# Patient Record
Sex: Male | Born: 1970 | Race: Black or African American | Hispanic: No | Marital: Single
Health system: Southern US, Community
[De-identification: ages and names within clinical notes are randomized; demographics above are authoritative.]

---

## 2020-03-08 ENCOUNTER — Other Ambulatory Visit: Payer: Self-pay

## 2020-03-08 ENCOUNTER — Emergency Department (HOSPITAL_COMMUNITY)
Admission: EM | Admit: 2020-03-08 | Discharge: 2020-03-08 | Disposition: A | Discharge status: Home / Self Care | Payer: Self-pay | Attending: Emergency Medicine | Admitting: Emergency Medicine

## 2020-03-08 ENCOUNTER — Encounter (HOSPITAL_COMMUNITY): Payer: Self-pay | Admitting: Pediatrics

## 2020-03-08 ENCOUNTER — Emergency Department (HOSPITAL_COMMUNITY): Payer: Self-pay

## 2020-03-08 DIAGNOSIS — R0602 Shortness of breath: Secondary | ICD-10-CM

## 2020-03-08 DIAGNOSIS — R059 Cough, unspecified: Secondary | ICD-10-CM

## 2020-03-08 DIAGNOSIS — E876 Hypokalemia: Secondary | ICD-10-CM

## 2020-03-08 DIAGNOSIS — Z20822 Contact with and (suspected) exposure to covid-19: Secondary | ICD-10-CM

## 2020-03-08 DIAGNOSIS — R05 Cough: Secondary | ICD-10-CM | POA: Insufficient documentation

## 2020-03-08 LAB — CBC WITH DIFFERENTIAL/PLATELET
Abs Immature Granulocytes: 0.02 10*3/uL (ref 0.00–0.07)
Basophils Absolute: 0.1 10*3/uL (ref 0.0–0.1)
Basophils Relative: 1 %
Eosinophils Absolute: 0.2 10*3/uL (ref 0.0–0.5)
Eosinophils Relative: 3 %
HCT: 48.3 % (ref 39.0–52.0)
Hemoglobin: 15.8 g/dL (ref 13.0–17.0)
Immature Granulocytes: 0 %
Lymphocytes Relative: 27 %
Lymphs Abs: 1.5 10*3/uL (ref 0.7–4.0)
MCH: 27.8 pg (ref 26.0–34.0)
MCHC: 32.7 g/dL (ref 30.0–36.0)
MCV: 85 fL (ref 80.0–100.0)
Monocytes Absolute: 0.5 10*3/uL (ref 0.1–1.0)
Monocytes Relative: 10 %
Neutro Abs: 3.2 10*3/uL (ref 1.7–7.7)
Neutrophils Relative %: 59 %
Platelets: 259 10*3/uL (ref 150–400)
RBC: 5.68 MIL/uL (ref 4.22–5.81)
RDW: 12 % (ref 11.5–15.5)
WBC: 5.4 10*3/uL (ref 4.0–10.5)
nRBC: 0 % (ref 0.0–0.2)

## 2020-03-08 LAB — BASIC METABOLIC PANEL
Anion gap: 12 (ref 5–15)
BUN: 19 mg/dL (ref 6–20)
CO2: 26 mmol/L (ref 22–32)
Calcium: 9.1 mg/dL (ref 8.9–10.3)
Chloride: 98 mmol/L (ref 98–111)
Creatinine, Ser: 1.28 mg/dL — ABNORMAL HIGH (ref 0.61–1.24)
GFR calc Af Amer: >60 mL/min (ref >60)
GFR calc non Af Amer: >60 mL/min (ref >60)
Glucose, Bld: 100 mg/dL — ABNORMAL HIGH (ref 70–99)
Potassium: 2.9 mmol/L — ABNORMAL LOW (ref 3.5–5.1)
Sodium: 136 mmol/L (ref 135–145)

## 2020-03-08 LAB — POC SARS CORONAVIRUS 2 AG -  ED: SARS Coronavirus 2 Ag: NEGATIVE

## 2020-03-08 MED ORDER — POTASSIUM CHLORIDE CRYS ER 20 MEQ PO TBCR
40.0000 meq | EXTENDED_RELEASE_TABLET | Freq: Once | ORAL | Status: AC
  Administered 2020-03-08: 40 meq via ORAL
  Filled 2020-03-08: qty 2

## 2020-03-08 MED ORDER — ALBUTEROL SULFATE HFA 108 (90 BASE) MCG/ACT IN AERS
2.0000 | INHALATION_SPRAY | Freq: Once | RESPIRATORY_TRACT | Status: AC
  Administered 2020-03-08: 2 via RESPIRATORY_TRACT
  Filled 2020-03-08: qty 6.7

## 2020-03-08 NOTE — ED Notes (Signed)
RN Will informed pt POC covid test neg 

## 2020-03-08 NOTE — ED Triage Notes (Signed)
C/O cough and generalized aches and pain for about 3 days now. Denies lost of taste and smell.

## 2020-03-08 NOTE — Discharge Instructions (Addendum)
You were seen in the emergency department for 2 or 3 days of cough and increased shortness of breath.  You had a chest x-ray that did not show any signs of pneumonia.  Your blood work was unremarkable other than a mildly low potassium.  We did a point-of-care Covid test that was negative and we also did a send out Covid test that was pending.  You can check this result on My Chart, instructions are in this paperwork.  You should isolate until your Covid test is back and negative.  If your Covid test is positive you will need to isolate up to 2 weeks.  You can use the albuterol inhaler 2 puffs every 4-6 hours as needed for shortness of breath.  Return to the emergency department if any worsening or concerning symptoms.

## 2020-03-08 NOTE — ED Provider Notes (Signed)
[facility] EMERGENCY DEPARTMENT Provider Note   CSN: [id number] Arrival date & time: 03/08/20  1619     History Chief Complaint  Patient presents with  . Cough    Colin Carr is a 49 y.o. male.  He is here with complaint of nonproductive cough low-grade fever shortness of breath and some generalized body aches that started 2 days ago.  No sick contacts or recent travel.  Denies any recent Covid testing or vaccines.  Quit smoking.  No nausea vomiting or abdominal pain although has a decreased appetite.  No loss of taste or smell.  Has tried nothing for it.  The history is provided by the patient.  Cough Cough characteristics:  Non-productive Sputum characteristics:  Nondescript Severity:  Moderate Onset quality:  Gradual Duration:  2 days Timing:  Intermittent Progression:  Unchanged Chronicity:  New Smoker: no   Context: not sick contacts   Relieved by:  None tried Worsened by:  Activity Ineffective treatments:  None tried Associated symptoms: fever, myalgias and shortness of breath   Associated symptoms: no chest pain, no diaphoresis, no headaches, no rash, no rhinorrhea, no sore throat and no wheezing        History reviewed. No pertinent past medical history.  There are no problems to display for this patient.   History reviewed. No pertinent surgical history.     No family history on file.  Social History   Tobacco Use  . Smoking status: Not on file  Substance Use Topics  . Alcohol use: Not on file  . Drug use: Not on file  No smoking denies drugs  Home Medications Prior to Admission medications   Not on File    Allergies    Novocain [procaine]  Review of Systems   Review of Systems  Constitutional: Positive for fever. Negative for diaphoresis.  HENT: Negative for rhinorrhea and sore throat.   Eyes: Negative for visual disturbance.  Respiratory: Positive for cough and shortness of breath. Negative for wheezing.     Cardiovascular: Negative for chest pain.  Gastrointestinal: Negative for abdominal pain.  Genitourinary: Negative for dysuria.  Musculoskeletal: Positive for myalgias.  Skin: Negative for rash.  Neurological: Negative for headaches.    Physical Exam Updated Vital Signs BP 124/74   Pulse (!) 106   Temp 99.1 F (37.3 C) (Oral)   Resp 18   Ht 5\' 3"  (1.6 m)   SpO2 97%   Physical Exam Vitals and nursing note reviewed.  Constitutional:      Appearance: He is well-developed.  HENT:     Head: Normocephalic and atraumatic.  Eyes:     Conjunctiva/sclera: Conjunctivae normal.  Cardiovascular:     Rate and Rhythm: Normal rate and regular rhythm.     Heart sounds: No murmur.  Pulmonary:     Effort: Pulmonary effort is normal. No respiratory distress.     Breath sounds: Normal breath sounds.  Abdominal:     Palpations: Abdomen is soft.     Tenderness: There is no abdominal tenderness.  Musculoskeletal:        General: No deformity or signs of injury. Normal range of motion.     Cervical back: Neck supple.     Right lower leg: No edema.     Left lower leg: No edema.  Skin:    General: Skin is warm and dry.     Capillary Refill: Capillary refill takes less than 2 seconds.  Neurological:     General: No  focal deficit present.     Mental Status: He is alert.     ED Results / Procedures / Treatments   Labs (all labs ordered are listed, but only abnormal results are displayed) Labs Reviewed  BASIC METABOLIC PANEL - Abnormal; Notable for the following components:      Result Value   Potassium 2.9 (*)    Glucose, Bld 100 (*)    Creatinine, Ser 1.28 (*)    All other components within normal limits  SARS CORONAVIRUS 2 (TAT 6-24 HRS)  CBC WITH DIFFERENTIAL/PLATELET  POC SARS CORONAVIRUS 2 AG -  ED    EKG EKG Interpretation  Date/Time:  Friday March 08 2020 20:57:55 EDT Ventricular Rate:  95 PR Interval:    QRS Duration: 82 QT Interval:  396 QTC Calculation: 498 R  Axis:   60 Text Interpretation: Sinus rhythm Borderline T wave abnormalities Borderline prolonged QT interval Baseline wander in lead(s) II V1 V2 No old tracing to compare Confirmed by Meridee Score [phone]) on 03/08/2020 9:00:21 PM   Radiology DG Chest 2 View  Result Date: 03/08/2020 CLINICAL DATA:  Cough and shortness of breath. Chest tightness. EXAM: CHEST - 2 VIEW COMPARISON:  None. FINDINGS: Upper normal heart size.The cardiomediastinal contours are normal. The lungs are clear. Pulmonary vasculature is normal. No consolidation, pleural effusion, or pneumothorax. No acute osseous abnormalities are seen. IMPRESSION: No acute findings or explanation for cough. Electronically Signed   By: Narda Rutherford M.D.   On: 03/08/2020 19:02    Procedures Procedures (including critical care time)  Medications Ordered in ED Medications  albuterol (VENTOLIN HFA) 108 (90 Base) MCG/ACT inhaler 2 puff (2 puffs Inhalation Given 03/08/20 2154)  potassium chloride SA (KLOR-CON) CR tablet 40 mEq (40 mEq Oral Given 03/08/20 2207)    ED Course  I have reviewed the triage vital signs and the nursing notes.  Pertinent labs & imaging results that were available during my care of the patient were reviewed by me and considered in my medical decision making (see chart for details).  Clinical Course as of Mar 09 2219  Fri Mar 08, 2020  2034 Chest x-ray interpreted by me as no gross infiltrates no pneumothorax.  No fluid overload   [MB]  2152 Labs coming back with a normal white count normal hemoglobin.  Point-of-care Covid testing negative.  Second Covid test ordered and waiting on BMP.  We will trial him with an albuterol inhaler.   [MB]  2153 Chemistry coming back with a low potassium of 2.9 and elevated creatinine of 1.28.  I do not have any prior labs to compare with.  We will give him a dose of oral potassium.   [MB]    Clinical Course User Index [MB] Terrilee Files, MD   MDM Rules/Calculators/A&P                      Colin Carr was evaluated in Emergency Department on 03/08/2020 for the symptoms described in the history of present illness. He was evaluated in the context of the global COVID-19 pandemic, which necessitated consideration that the patient might be at risk for infection with the SARS-CoV-2 virus that causes COVID-19. Institutional protocols and algorithms that pertain to the evaluation of patients at risk for COVID-19 are in a state of rapid change based on information released by regulatory bodies including the CDC and federal and state organizations. These policies and algorithms were followed during the patient's care in the ED.  This patient complains of cough and shortness of breath; this involves an extensive number of treatment Options and is a complaint that carries with it a high risk of complications and Morbidity. The differential includes pneumonia, bronchitis, Covid, PE, pneumothorax, ACS, anemia, metabolic derangement  I ordered, reviewed and interpreted labs, which included normal white blood cell count normal hemoglobin.  Chemistry showing a mildly low potassium of 2.  9 which has been orally repleted.  Point-of-care Covid testing negative. I ordered medication potassium and albuterol inhaler I ordered imaging studies which included chest x-ray and I independently    visualized and interpreted imaging which showed no infiltrates   After the interventions stated above, I reevaluated the patient and found improved and his shortness of breath.  He understands that he will need to isolate until his Covid testing is resulted.  We discussed return instructions   Final Clinical Impression(s) / ED Diagnoses Final diagnoses:  SOB (shortness of breath)  Cough  Person under investigation for COVID-19  Hypokalemia    Rx / DC Orders ED Discharge Orders    None       Terrilee Files, MD 03/08/20 2222

## 2020-03-08 NOTE — ED Notes (Signed)
Pt verbalized understanding of d/c instructions, follow up care and s/s requiring return to ED. Pt had no additional questions at this time. Transported to exit via wheelchair.

## 2020-03-09 LAB — SARS CORONAVIRUS 2 (TAT 6-24 HRS): SARS Coronavirus 2: NEGATIVE

## 2020-12-22 IMAGING — CR DG CHEST 2V
2 series · 2 of 2 positions shown · non-contrast
Comparison: None.

CLINICAL DATA: Cough and shortness of breath. Chest tightness.

EXAM:
CHEST - 2 VIEW

[chest pa]
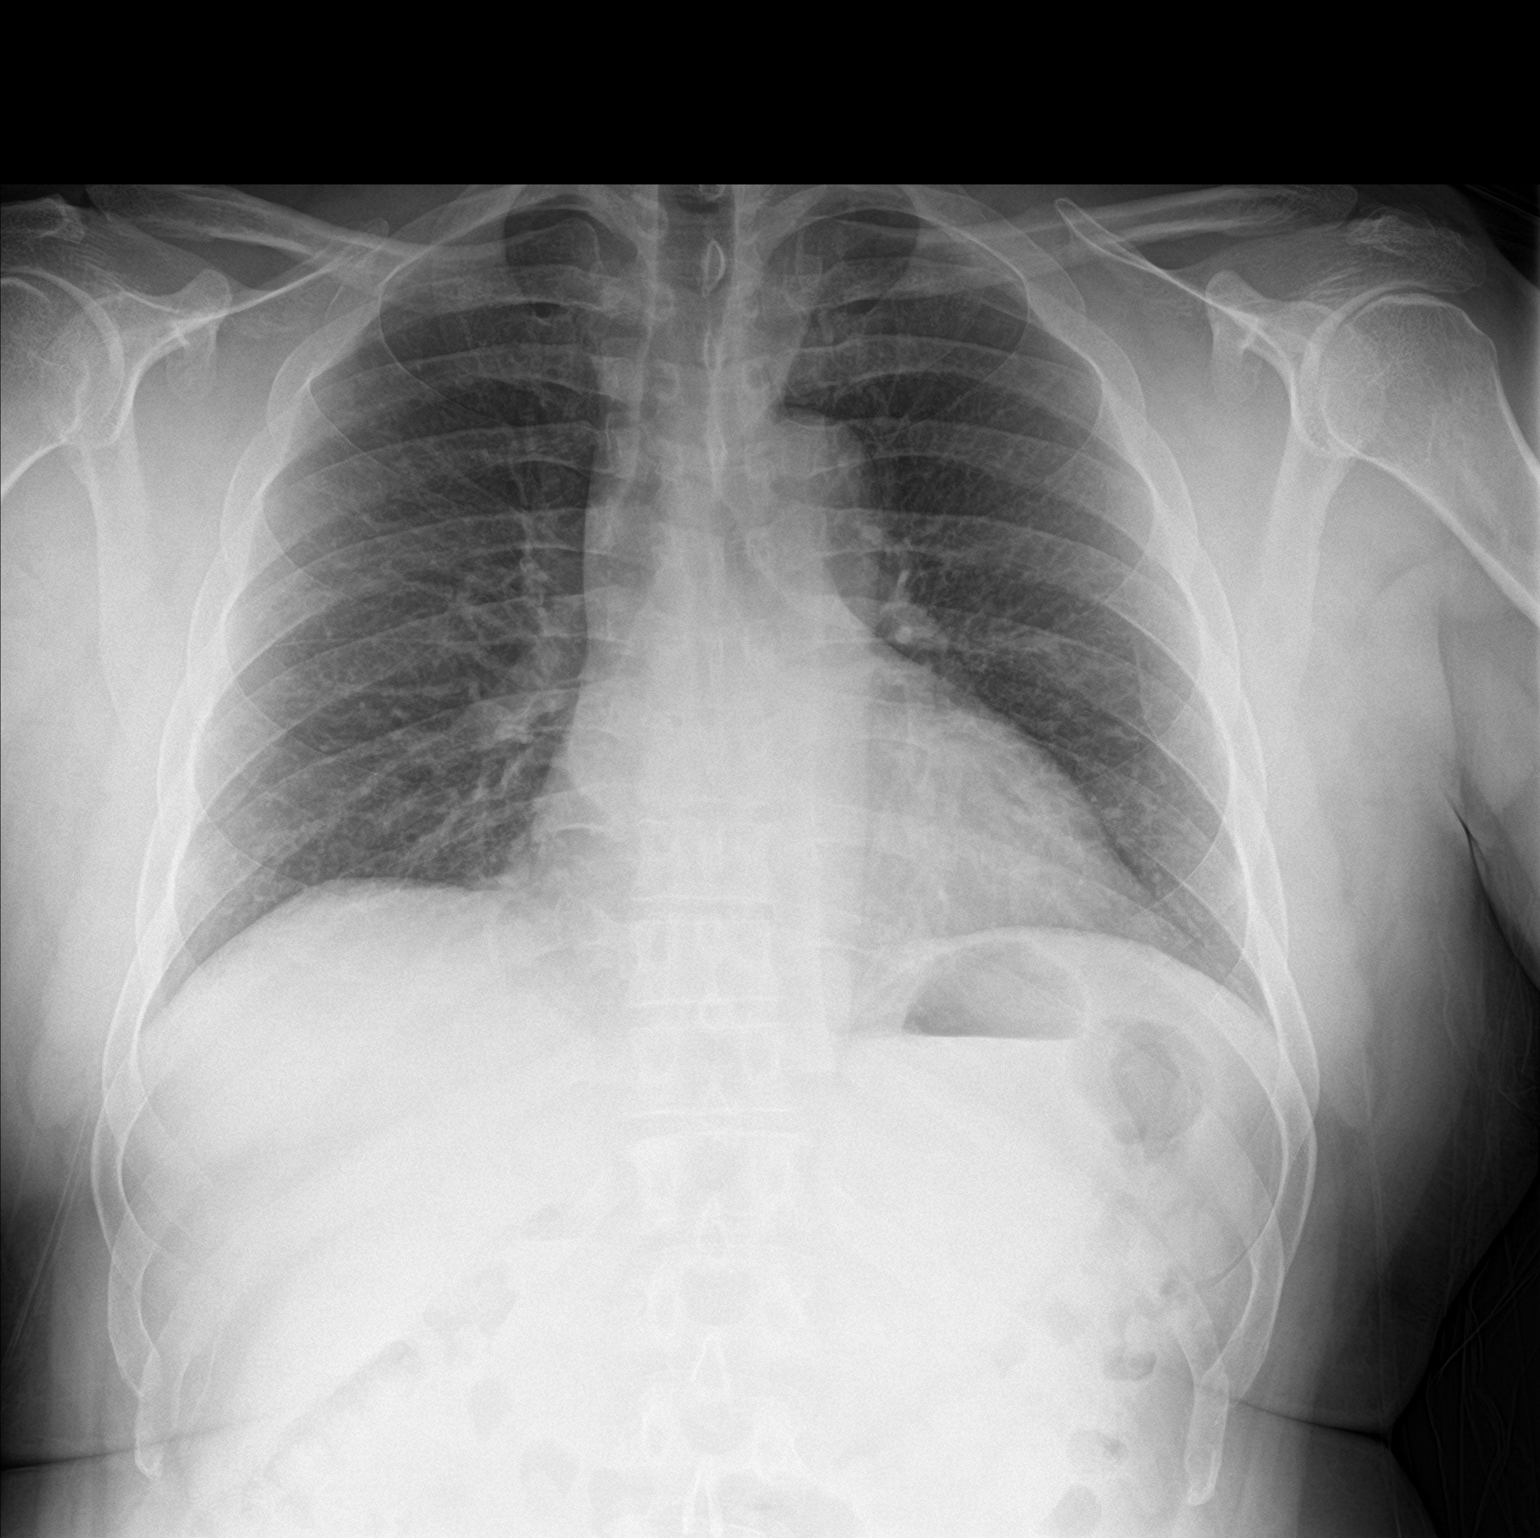

[chest lat]
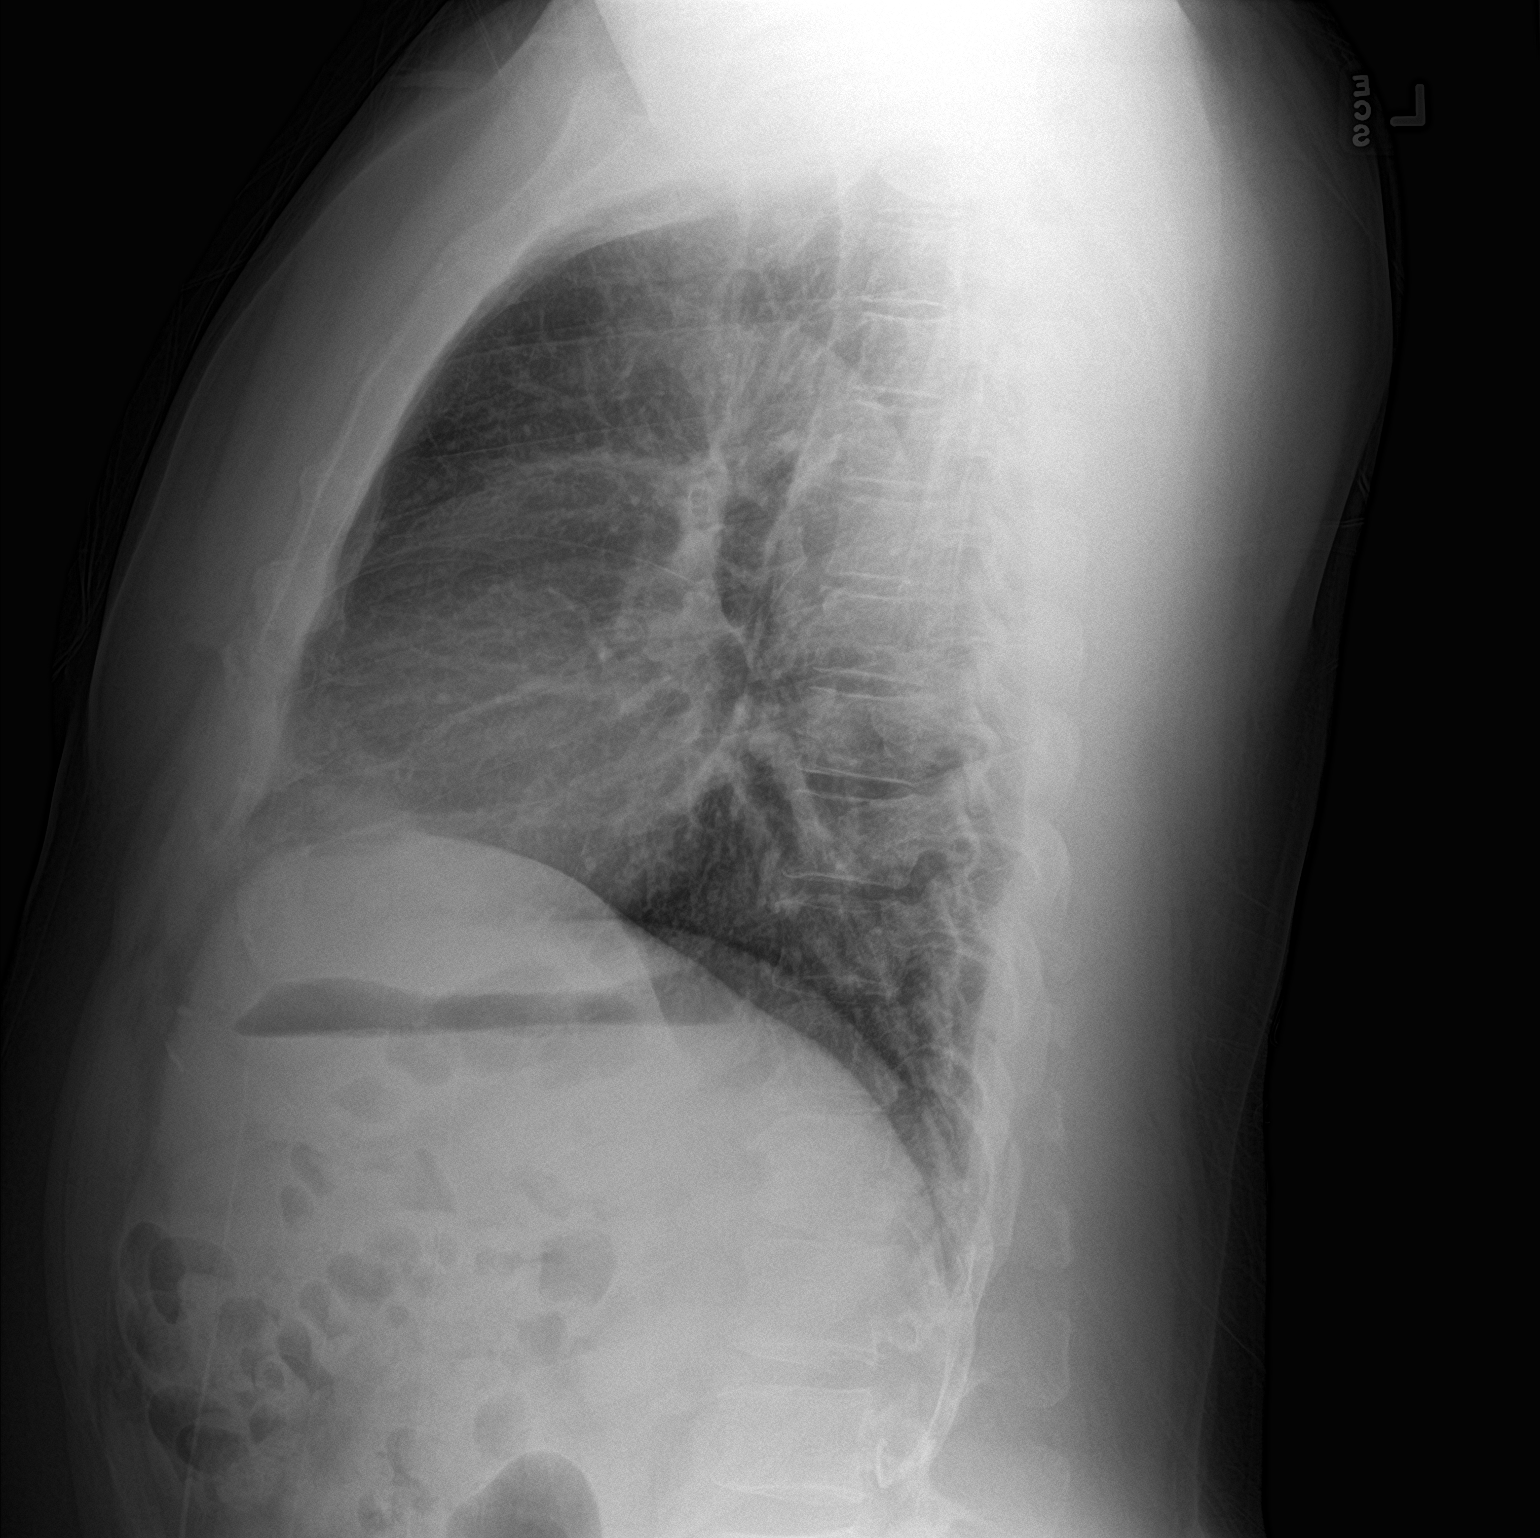

[2 of 2 positions shown; findings below may reference images not displayed]

FINDINGS: Upper normal heart size.The cardiomediastinal contours are normal.
The lungs are clear. Pulmonary vasculature is normal. No
consolidation, pleural effusion, or pneumothorax. No acute osseous
abnormalities are seen.
IMPRESSION: No acute findings or explanation for cough.
